# Patient Record
Sex: Female | Born: 1975 | Race: White | Hispanic: No
Health system: Southern US, Community
[De-identification: ages and names within clinical notes are randomized; demographics above are authoritative.]

## PROBLEM LIST (undated history)

## (undated) DIAGNOSIS — K219 Gastro-esophageal reflux disease without esophagitis: Secondary | ICD-10-CM

## (undated) DIAGNOSIS — M1990 Unspecified osteoarthritis, unspecified site: Secondary | ICD-10-CM

## (undated) HISTORY — DX: Unspecified osteoarthritis, unspecified site: M19.90

## (undated) HISTORY — PX: ABDOMINAL HYSTERECTOMY: SHX81

## (undated) HISTORY — PX: TONSILLECTOMY: SUR1361

## (undated) HISTORY — DX: Gastro-esophageal reflux disease without esophagitis: K21.9

## (undated) HISTORY — PX: CHOLECYSTECTOMY: SHX55

---

## 2015-11-29 ENCOUNTER — Other Ambulatory Visit (HOSPITAL_COMMUNITY): Payer: Self-pay | Admitting: General Surgery

## 2015-12-06 ENCOUNTER — Ambulatory Visit (HOSPITAL_COMMUNITY)
Admission: RE | Admit: 2015-12-06 | Discharge: 2015-12-06 | Disposition: A | Discharge status: Home / Self Care | Payer: BC Managed Care – PPO | Source: Ambulatory Visit | Attending: General Surgery | Admitting: General Surgery

## 2015-12-06 ENCOUNTER — Other Ambulatory Visit: Payer: Self-pay

## 2015-12-19 ENCOUNTER — Encounter: Payer: BC Managed Care – PPO | Attending: General Surgery | Admitting: Dietician

## 2015-12-19 ENCOUNTER — Encounter: Payer: Self-pay | Admitting: Dietician

## 2015-12-19 VITALS — Ht 59.5 in | Wt 181.8 lb

## 2015-12-19 DIAGNOSIS — Z01818 Encounter for other preprocedural examination: Secondary | ICD-10-CM | POA: Diagnosis present

## 2015-12-19 DIAGNOSIS — E669 Obesity, unspecified: Secondary | ICD-10-CM

## 2015-12-19 NOTE — Patient Instructions (Signed)
Follow Pre-Op Goals Try Protein Shakes Call [facility] at [phone] when surgery is scheduled to enroll in Pre-Op Class  Things to remember:  Please always be honest with us. We want to support you!  If you have any questions or concerns in between appointments, please call or email Liz, Leslie, or Laurie.  The diet after surgery will be high protein and low in carbohydrate.  Vitamins and calcium need to be taken for the rest of your life.  Feel free to include support people in any classes or appointments.   Supplement recommendations:  Complete" Multivitamin: Sleeve Gastrectomy and RYGB patients take a double dose of MVI. LAGB patients take single dose as it is written on the package. Vitamin must be liquid or chewable but not gummy. Examples of these include Flintstones Complete and Centrum Complete. If the vitamin is bariatric-specific, take 1 dose as it is already formulated for bariatric surgery patients. Examples of these are Bariatric Advantage, Celebrate, and Wellesse. These can be found at the [facility] and/or online.     Calcium citrate: 1500 mg/day of Calcium citrate (also chewable or liquid) is recommended for all procedures. The body is only able to absorb 500-600 mg of Calcium at one time so 3 daily doses of 500 mg are recommended. Calcium doses must be taken a minimum of 2 hours apart. Additionally, Calcium must be taken 2 hours apart from iron-containing MVI. Examples of brands include Celebrate, Bariatric Advantage, and Wellesse. These brands must be purchased online or at the [facility]. Citracal Petites is the only Calcium citrate supplement found in general grocery stores and pharmacies. This is in tablet form and may be recommended for patients who do not tolerate chewable Calcium.  Continued or added Vitamin D supplementation based on individual needs.    Vitamin B12: 300-500 mcg/day for Sleeve Gastrectomy and RYGB. Optional for  LAGB patients as stomach remains fully intact. Must be taken intramuscularly, sublingually, or inhaled nasally. Oral route is not recommended. 

## 2015-12-19 NOTE — Progress Notes (Signed)
  Pre-Op Assessment Visit:  Pre-Operative RYGB Surgery  Medical Nutrition Therapy:  Appt start time: 1455   End time:  1530.  Patient was seen on 12/19/2015 for Pre-Operative Nutrition Assessment. [facility] on 12/19/2015.   Preferred Learning Style:   No preference indicated   Learning Readiness:   Ready  Handouts given during visit include:  Pre-Op Goals Bariatric Surgery Protein Shakes   During the appointment today the following Pre-Op Goals were reviewed with the patient: Maintain or lose weight as instructed by your surgeon Make healthy food choices Begin to limit portion sizes Limited concentrated sugars and fried foods Keep fat/sugar in the single digits per serving on   food labels Practice CHEWING your food  (aim for 30 chews per bite or until applesauce consistency) Practice not drinking 15 minutes before, during, and 30 minutes after each meal/snack Avoid all carbonated beverages  Avoid/limit caffeinated beverages  Avoid all sugar-sweetened beverages Consume 3 meals per day; eat every 3-5 hours Make a list of non-food related activities Aim for 64-100 ounces of FLUID daily  Aim for at least 60-80 grams of PROTEIN daily Look for a liquid protein source that contain ?15 g protein and ?5 g carbohydrate  (ex: shakes, drinks, shots)  Patient-Centered Goals: Goals: Improve acid reflux,   10 confidence/10 importance scale   Demonstrated degree of understanding via:  Teach Back  Teaching Method Utilized:  Visual Auditory Hands on  Barriers to learning/adherence to lifestyle change: none  [facility] to call the [facility] to enroll in Pre-Op and Post-Op Nutrition Education when surgery date is scheduled.

## 2016-03-28 MED FILL — oxyCODONE HCL 5 MG/5ML SOLN: 5 | 3 days supply | Qty: 200 | Fill #0

## 2016-03-31 ENCOUNTER — Encounter: Payer: BC Managed Care – PPO | Attending: General Surgery

## 2016-03-31 DIAGNOSIS — Z01818 Encounter for other preprocedural examination: Secondary | ICD-10-CM | POA: Insufficient documentation

## 2016-04-02 NOTE — Progress Notes (Signed)
  Pre-Operative Nutrition Class:  Appt start time: 1027   End time:  1830.  Patient was seen on 03/31/2016 for Pre-Operative Bariatric Surgery Education at the [facility].   Surgery date:  Surgery type: RYGB Start weight at [facility]: 182 lbs on 12/19/2015 Weight today: 177.4 lbs  TANITA  BODY COMP RESULTS  03/31/16   BMI (kg/m^2) 34.6   Fat Mass (lbs) 77   Fat Free Mass (lbs) 100.4   Total Body Water (lbs) 71.8   Samples given per MNT protocol. Patient educated on appropriate usage: Premier protein shake (vanilla - qty 1) Lot #: [id number] Exp: 10/2016  Bariatric Advantage Calcium Citrate chew (lemon - qty 1) Lot #: [id number] Exp: 01/2017  Bariatric Advantage Vitamins Multivitamin (mixed fruit - qty 1) Lot #: [id number] Exp: 02/2017  Renee Pain Protein Powder (chocolate - qty 1) Lot #: [id number] Exp: 05/2017  The following the learning objectives were met by the patient during this course:  Identify Pre-Op Dietary Goals and will begin 2 weeks pre-operatively  Identify appropriate sources of fluids and proteins   State protein recommendations and appropriate sources pre and post-operatively  Identify Post-Operative Dietary Goals and will follow for 2 weeks post-operatively  Identify appropriate multivitamin and calcium sources  Describe the need for physical activity post-operatively and will follow MD recommendations  State when to call healthcare provider regarding medication questions or post-operative complications  Handouts given during class include:  Pre-Op Bariatric Surgery Diet Handout  Protein Shake Handout  Post-Op Bariatric Surgery Nutrition Handout  BELT Program Information Flyer  Support Group Information Flyer  [facility] Bariatric Supplements Price List  Follow-Up Plan: Patient will follow-up at [facility] 2 weeks post operatively for diet advancement per MD.

## 2017-07-13 IMAGING — CR DG CHEST 2V
2 series · 2 of 2 positions shown · non-contrast
Comparison: None.

CLINICAL DATA: Preoperative evaluation for bariatric surgery.
Morbid obesity.

EXAM:
CHEST  2 VIEW

[w chest pa]
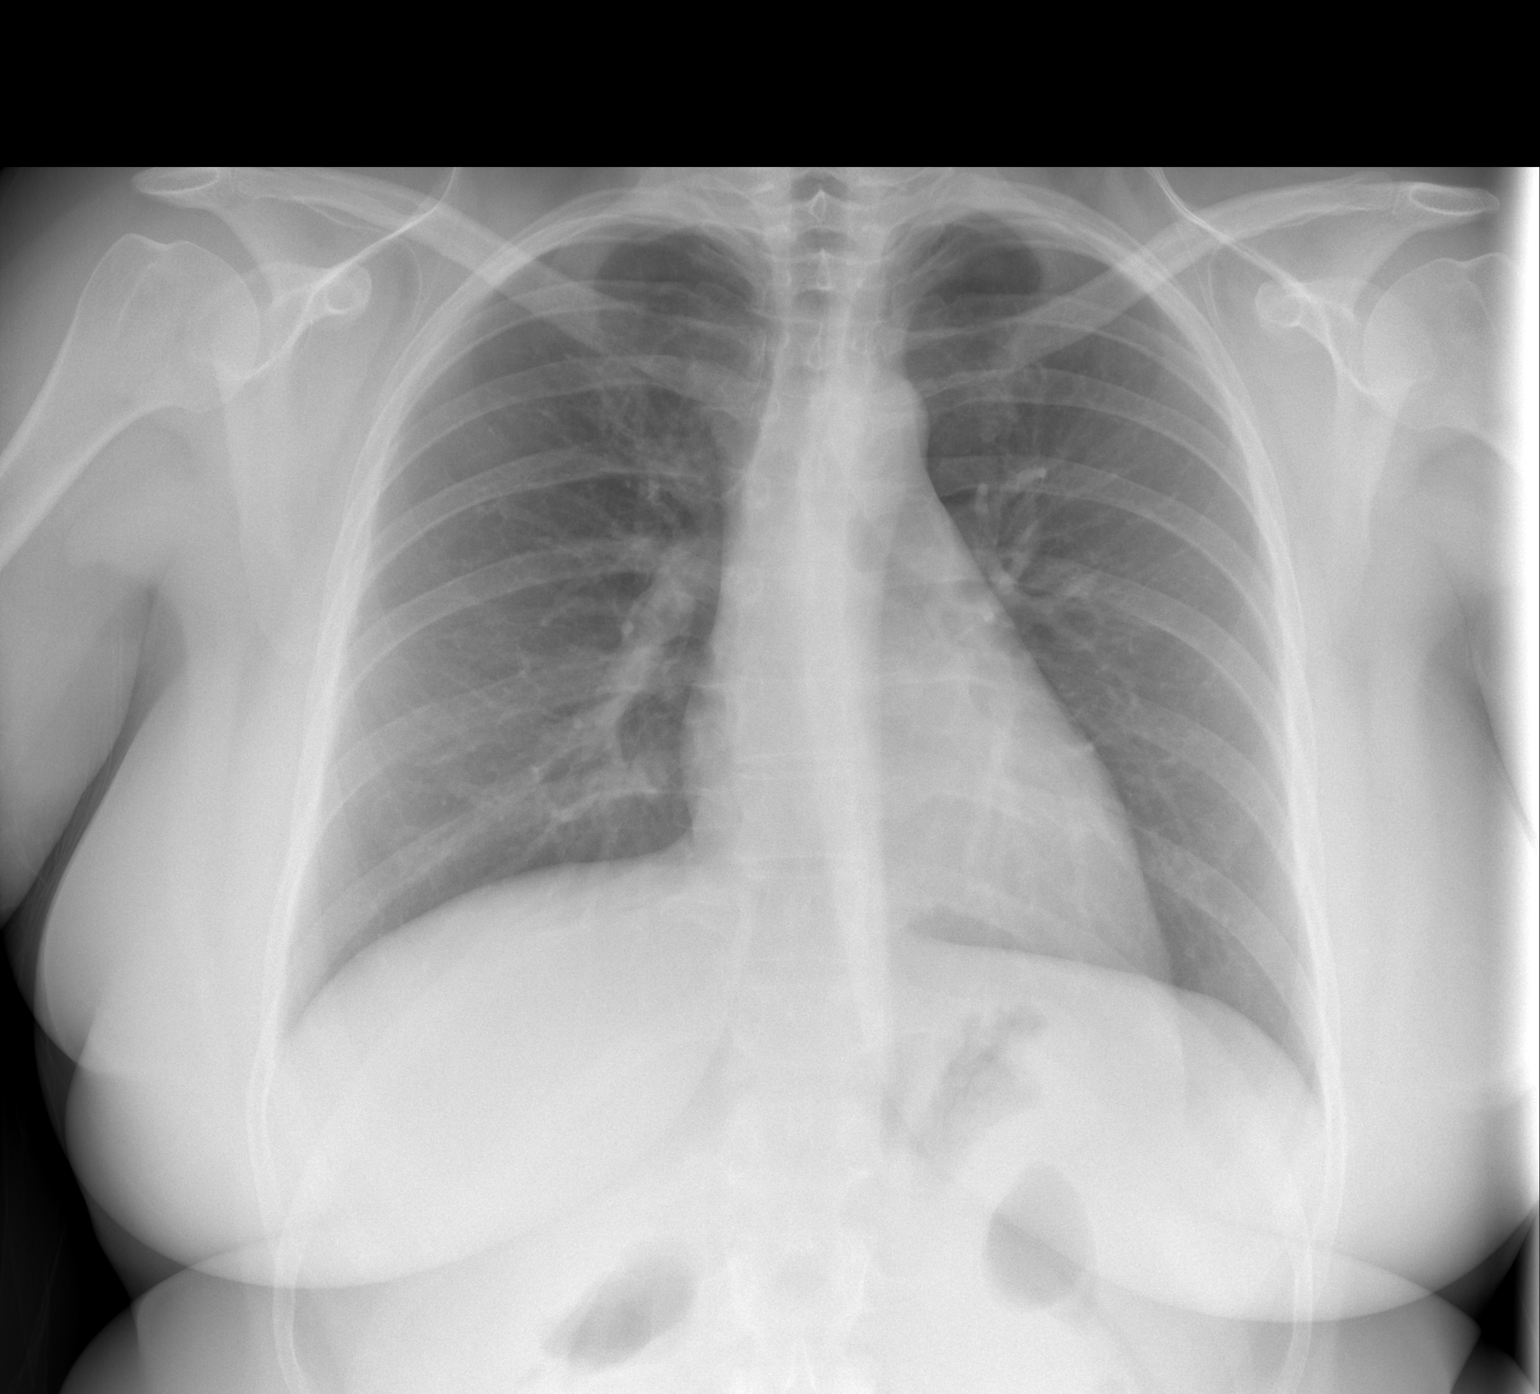

[w chest lat]
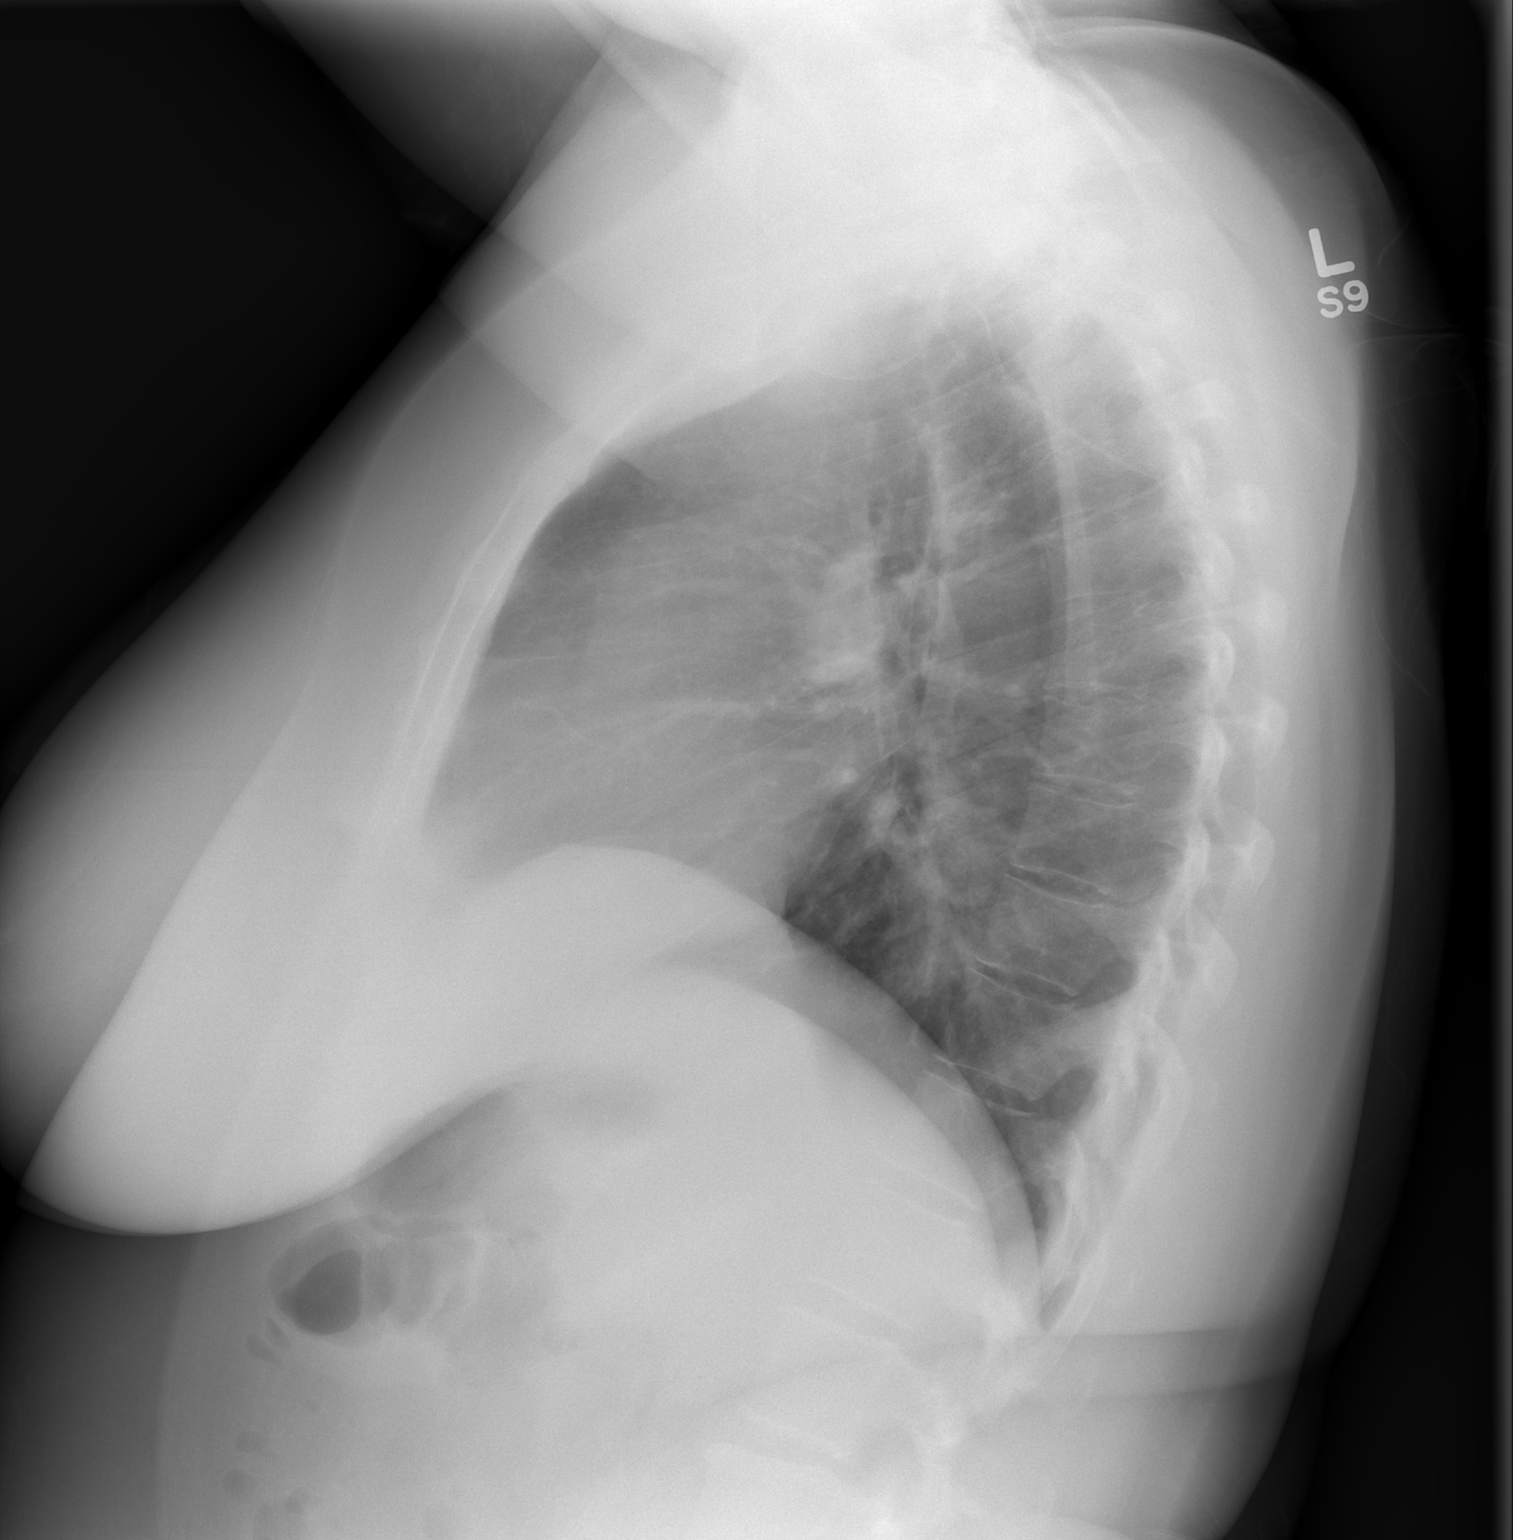

[2 of 2 positions shown; findings below may reference images not displayed]

FINDINGS: Lungs are clear. Heart size and pulmonary vascularity are normal. No
adenopathy. No bone lesions. There is slight upper thoracic
levoscoliosis.
IMPRESSION: No edema or consolidation.

## 2023-05-05 ENCOUNTER — Other Ambulatory Visit: Payer: Self-pay | Admitting: Family

## 2023-05-05 DIAGNOSIS — Z1231 Encounter for screening mammogram for malignant neoplasm of breast: Secondary | ICD-10-CM

## 2023-05-08 ENCOUNTER — Ambulatory Visit
Admission: RE | Admit: 2023-05-08 | Discharge: 2023-05-08 | Disposition: A | Discharge status: Home / Self Care | Payer: BC Managed Care – PPO | Source: Ambulatory Visit | Attending: Student | Admitting: Student

## 2023-05-08 DIAGNOSIS — Z1231 Encounter for screening mammogram for malignant neoplasm of breast: Secondary | ICD-10-CM

## 2024-05-30 ENCOUNTER — Other Ambulatory Visit: Payer: Self-pay | Admitting: Family

## 2024-05-30 DIAGNOSIS — Z1231 Encounter for screening mammogram for malignant neoplasm of breast: Secondary | ICD-10-CM

## 2024-05-31 ENCOUNTER — Inpatient Hospital Stay
Admission: RE | Admit: 2024-05-31 | Discharge: 2024-05-31 | Disposition: A | Payer: BC Managed Care – PPO | Source: Ambulatory Visit | Attending: Family | Admitting: Family

## 2024-05-31 DIAGNOSIS — Z1231 Encounter for screening mammogram for malignant neoplasm of breast: Secondary | ICD-10-CM
